# Patient Record
Sex: Male | Born: 1975 | Race: Black or African American | Hispanic: No | Marital: Married | Smoking: Light tobacco smoker
Health system: Southern US, Community
[De-identification: ages and names within clinical notes are randomized; demographics above are authoritative.]

## PROBLEM LIST (undated history)

## (undated) HISTORY — PX: SHOULDER SURGERY: SHX246

---

## 2002-08-15 ENCOUNTER — Emergency Department (HOSPITAL_COMMUNITY): Admission: EM | Admit: 2002-08-15 | Discharge: 2002-08-15 | Payer: Self-pay | Admitting: *Deleted

## 2003-03-06 ENCOUNTER — Emergency Department (HOSPITAL_COMMUNITY): Admission: EM | Admit: 2003-03-06 | Discharge: 2003-03-06 | Payer: Self-pay | Admitting: Emergency Medicine

## 2003-06-25 ENCOUNTER — Emergency Department (HOSPITAL_COMMUNITY): Admission: EM | Admit: 2003-06-25 | Discharge: 2003-06-26 | Payer: Self-pay | Admitting: Emergency Medicine

## 2003-06-26 ENCOUNTER — Encounter: Payer: Self-pay | Admitting: Emergency Medicine

## 2003-11-08 ENCOUNTER — Emergency Department (HOSPITAL_COMMUNITY): Admission: EM | Admit: 2003-11-08 | Discharge: 2003-11-08 | Payer: Self-pay | Admitting: Emergency Medicine

## 2009-02-08 ENCOUNTER — Emergency Department (HOSPITAL_COMMUNITY): Admission: EM | Admit: 2009-02-08 | Discharge: 2009-02-08 | Payer: Self-pay | Admitting: Emergency Medicine

## 2010-03-05 IMAGING — CR DG KNEE COMPLETE 4+V*L*
4 series · 4 of 4 positions shown · non-contrast
Comparison: None.

CLINICAL DATA: Knee pain.  No injury.

LEFT KNEE - COMPLETE 4+ VIEW

[t knee ap left]
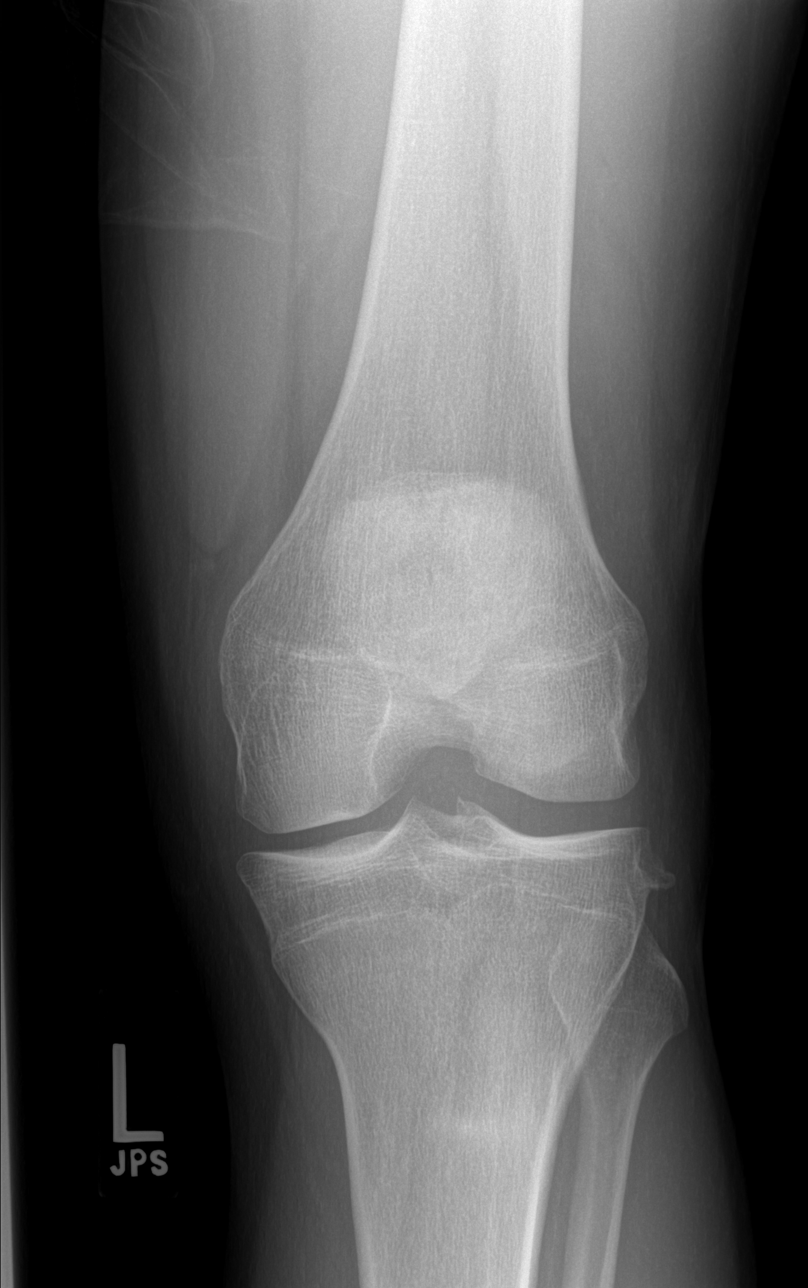

[t knee oblique left (1 of 2)]
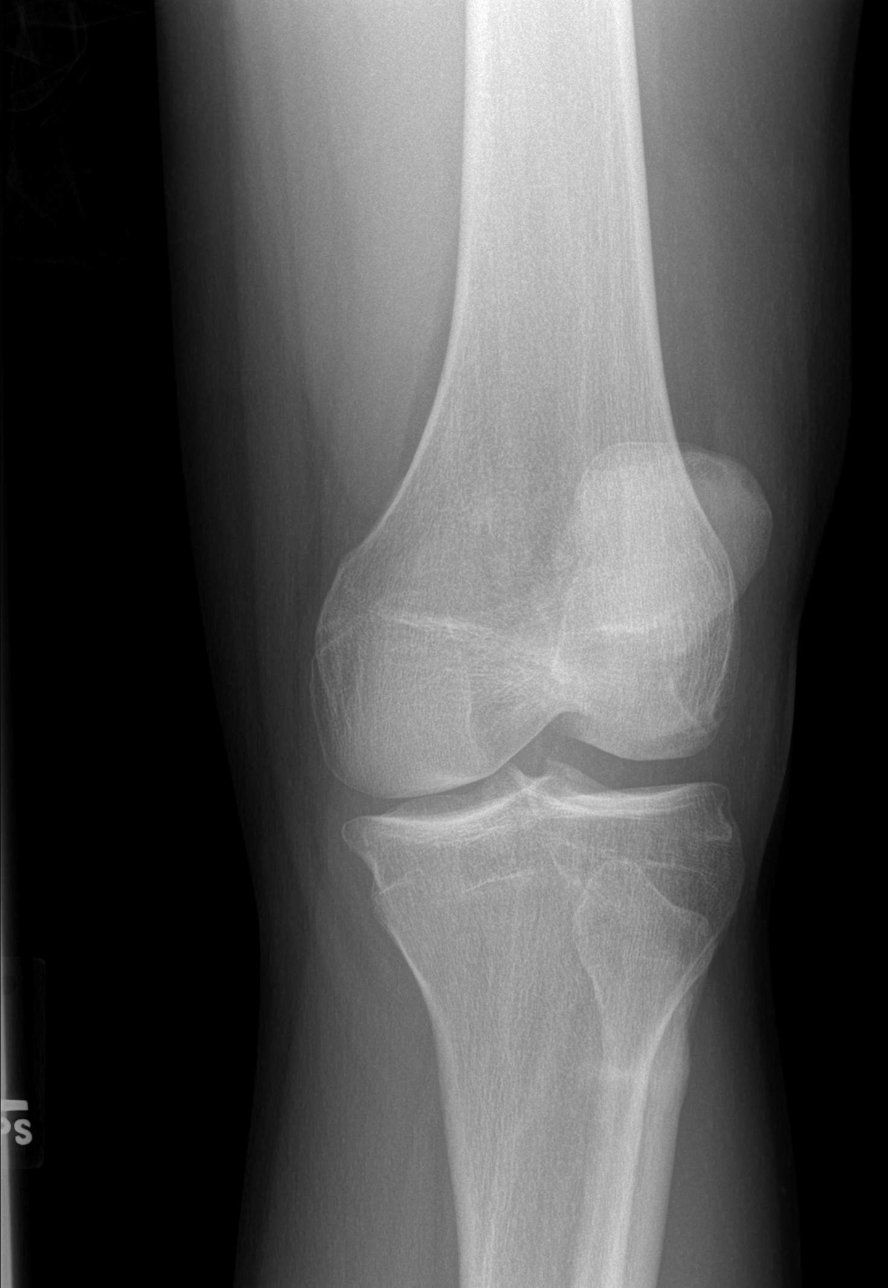

[t knee oblique left (2 of 2)]
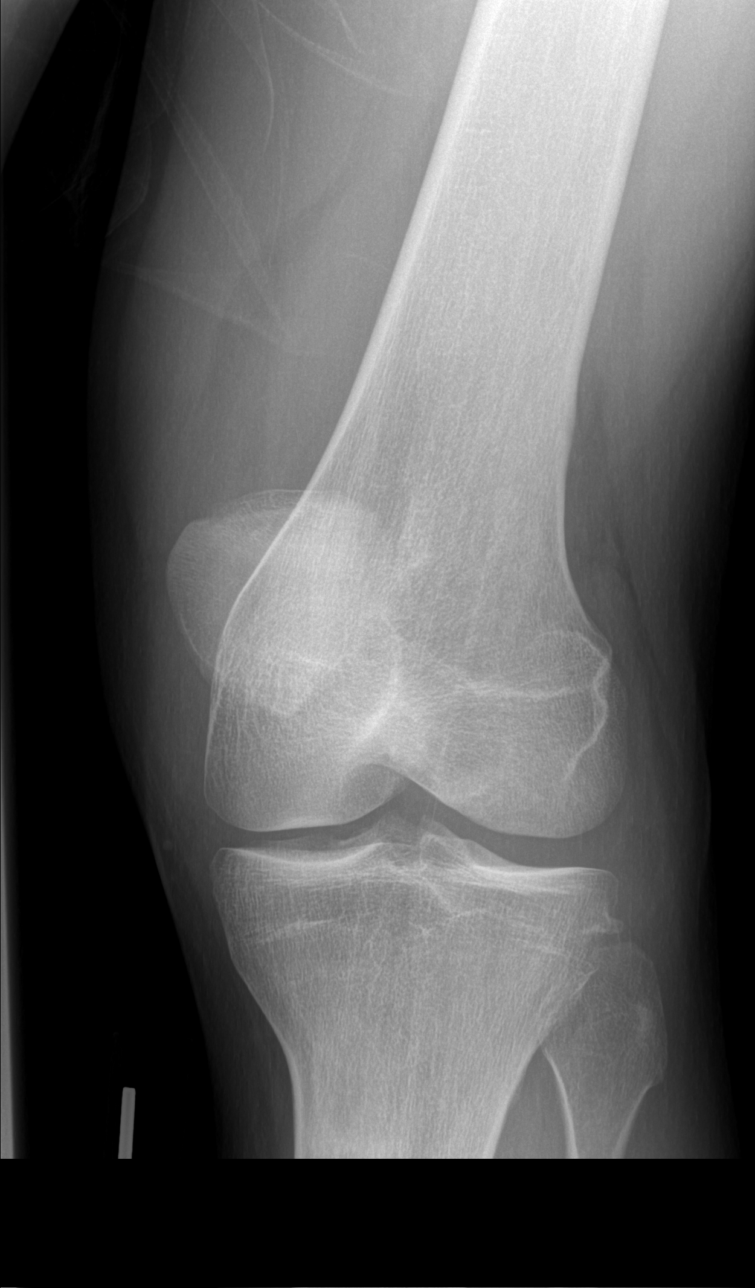

[t knee lat left]
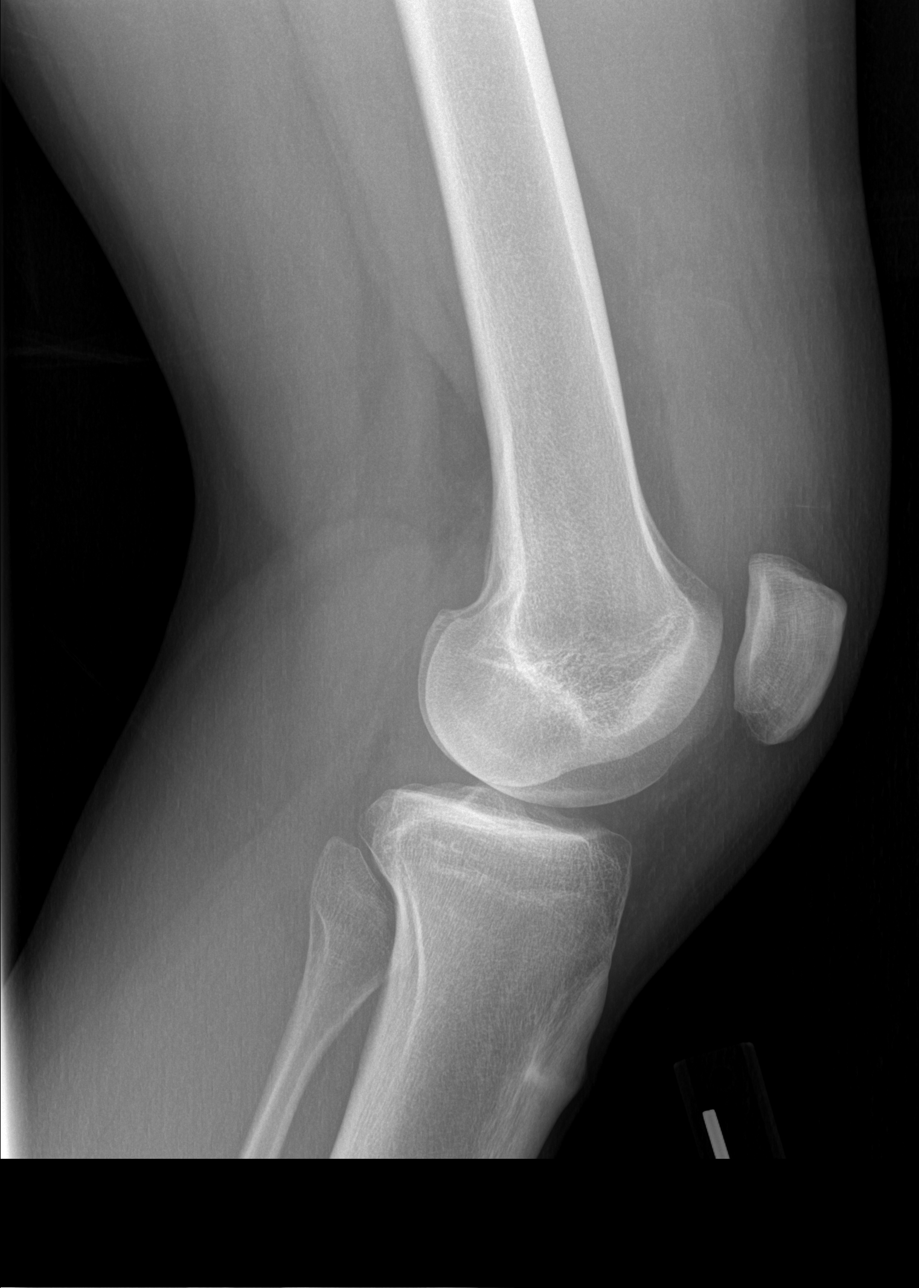

[4 of 4 positions shown; findings below may reference images not displayed]

FINDINGS: There is a lateral osteophyte arising from the lateral
aspect of the proximal tibia.  No fracture is identified.  Joint
spaces are well maintained.  There is a moderate-to-large joint
effusion.  This may indicate internal derangement.
IMPRESSION: Joint effusion without acute bony abnormality.

## 2018-08-13 ENCOUNTER — Emergency Department (HOSPITAL_BASED_OUTPATIENT_CLINIC_OR_DEPARTMENT_OTHER): Payer: 59

## 2018-08-13 ENCOUNTER — Emergency Department (HOSPITAL_BASED_OUTPATIENT_CLINIC_OR_DEPARTMENT_OTHER)
Admission: EM | Admit: 2018-08-13 | Discharge: 2018-08-13 | Disposition: A | Discharge status: Home / Self Care | Payer: 59 | Attending: Emergency Medicine | Admitting: Emergency Medicine

## 2018-08-13 ENCOUNTER — Encounter (HOSPITAL_BASED_OUTPATIENT_CLINIC_OR_DEPARTMENT_OTHER): Payer: Self-pay | Admitting: Emergency Medicine

## 2018-08-13 ENCOUNTER — Other Ambulatory Visit: Payer: Self-pay

## 2018-08-13 DIAGNOSIS — S39012A Strain of muscle, fascia and tendon of lower back, initial encounter: Secondary | ICD-10-CM | POA: Insufficient documentation

## 2018-08-13 DIAGNOSIS — Y939 Activity, unspecified: Secondary | ICD-10-CM | POA: Insufficient documentation

## 2018-08-13 DIAGNOSIS — F17210 Nicotine dependence, cigarettes, uncomplicated: Secondary | ICD-10-CM | POA: Diagnosis not present

## 2018-08-13 DIAGNOSIS — Y9389 Activity, other specified: Secondary | ICD-10-CM | POA: Diagnosis not present

## 2018-08-13 DIAGNOSIS — X509XXA Other and unspecified overexertion or strenuous movements or postures, initial encounter: Secondary | ICD-10-CM | POA: Insufficient documentation

## 2018-08-13 DIAGNOSIS — Y999 Unspecified external cause status: Secondary | ICD-10-CM | POA: Diagnosis not present

## 2018-08-13 DIAGNOSIS — Y929 Unspecified place or not applicable: Secondary | ICD-10-CM | POA: Diagnosis not present

## 2018-08-13 DIAGNOSIS — S3992XA Unspecified injury of lower back, initial encounter: Secondary | ICD-10-CM | POA: Diagnosis present

## 2018-08-13 NOTE — ED Triage Notes (Signed)
PT presents with c/o lower back pain since Sunday and was seen at [facility] yesterday for same. Pt states he is no better and feels like his right leg is tingling. Family states his lower back is swollen.

## 2018-08-13 NOTE — Discharge Instructions (Signed)
Please read and follow all provided instructions.  Your diagnoses today include:  1. Strain of lumbar region, initial encounter     Tests performed today include:  Vital signs - see below for your results today  X-ray of your lower back -does not show any problems  Medications prescribed:   None  Take any prescribed medications only as directed.  Home care instructions:   Follow any educational materials contained in this packet  Please rest, use ice or heat on your back for the next several days  Do not lift, push, pull anything more than 10 pounds for the next week  Follow-up instructions: Please follow-up with your primary care provider in the next 1 week for further evaluation of your symptoms.   Return instructions:  SEEK IMMEDIATE MEDICAL ATTENTION IF YOU HAVE:  New numbness, tingling, weakness, or problem with the use of your arms or legs  Severe back pain not relieved with medications  Loss control of your bowels or bladder  Increasing pain in any areas of the body (such as chest or abdominal pain)  Shortness of breath, dizziness, or fainting.   Worsening nausea (feeling sick to your stomach), vomiting, fever, or sweats  Any other emergent concerns regarding your health   Additional Information:  Your vital signs today were: BP 122/90 (BP Location: Left Arm)    Pulse 79    Temp 98.2 F (36.8 C) (Oral)    Resp 18    Ht 6\' 1"  (1.854 m)    Wt 83.9 kg    SpO2 100%    BMI 24.41 kg/m  If your blood pressure (BP) was elevated above 135/85 this visit, please have this repeated by your doctor within one month. --------------

## 2018-08-13 NOTE — ED Provider Notes (Signed)
[facility] EMERGENCY DEPARTMENT Provider Note   CSN: [id number] Arrival date & time: 08/13/18  0915     History   Chief Complaint Chief Complaint  Patient presents with  . Back Pain    HPI George Cox is a 42 y.o. male.  Patient presents to the emergency department with complaint of lower back pain ongoing over the past 2 to 3 days.  Pain started when the patient bent over and twisted to pick up some garbage.  He had persistent pain that did not improve.  In certain positions he has "tingling" that extends into the right leg.  He was seen at [facility] hospital yesterday and was prescribed Flexeril and a Medrol Dosepak.  He is taken 1 dose of these medications.  Yesterday he reports feeling off balance and having a fall.  Since that time he feels like his right lower back is swollen. Patient denies warning symptoms of back pain including: fecal incontinence, urinary retention or overflow incontinence, night sweats, waking from sleep with back pain, unexplained fevers or weight loss, h/o cancer, IVDU, recent trauma.        History reviewed. No pertinent past medical history.  There are no active problems to display for this patient.   Past Surgical History:  Procedure Laterality Date  . SHOULDER SURGERY          Home Medications    Prior to Admission medications   Not on File    Family History No family history on file.  Social History Social History   Tobacco Use  . Smoking status: Light Tobacco Smoker  . Smokeless tobacco: Never Used  Substance Use Topics  . Alcohol use: Yes  . Drug use: Never     Allergies   Patient has no known allergies.   Review of Systems Review of Systems  Constitutional: Negative for fever and unexpected weight change.  Gastrointestinal: Negative for constipation.       Neg for fecal incontinence  Genitourinary: Negative for difficulty urinating, flank pain and hematuria.       Negative for urinary  incontinence or retention  Musculoskeletal: Positive for back pain.  Neurological: Negative for weakness and numbness.       Negative for saddle paresthesias      Physical Exam Updated Vital Signs BP 122/90 (BP Location: Left Arm)   Pulse 79   Temp 98.2 F (36.8 C) (Oral)   Resp 18   Ht 6\' 1"  (1.854 m)   Wt 83.9 kg   SpO2 100%   BMI 24.41 kg/m   Physical Exam  Constitutional: He appears well-developed and well-nourished.  HENT:  Head: Normocephalic and atraumatic.  Eyes: Conjunctivae are normal.  Neck: Normal range of motion.  Abdominal: Soft. There is no tenderness. There is no CVA tenderness.  Musculoskeletal: Normal range of motion.       Cervical back: He exhibits normal range of motion, no tenderness and no bony tenderness.       Thoracic back: He exhibits normal range of motion, no tenderness and no bony tenderness.       Lumbar back: He exhibits tenderness. He exhibits normal range of motion and no bony tenderness.       Back:  No step-off noted with palpation of spine.   Neurological: He is alert. He has normal reflexes. No sensory deficit. He exhibits normal muscle tone.  5/5 strength in entire lower extremities bilaterally. No sensation deficit.   Skin: Skin is warm and dry.  Psychiatric: He has a normal mood and affect.  Nursing note and vitals reviewed.    ED Treatments / Results  Labs (all labs ordered are listed, but only abnormal results are displayed) Labs Reviewed - No data to display  EKG None  Radiology Dg Lumbar Spine Complete  Result Date: 08/13/2018 CLINICAL DATA:  Acute low back and left leg pain after fall. EXAM: LUMBAR SPINE - COMPLETE 4+ VIEW COMPARISON:  None. FINDINGS: There is no evidence of lumbar spine fracture. Alignment is normal. Intervertebral disc spaces are maintained. IMPRESSION: Negative. Electronically Signed   By: Lupita Raider, M.D.   On: 08/13/2018 11:56    Procedures Procedures (including critical care  time)  Medications Ordered in ED Medications - No data to display   Initial Impression / Assessment and Plan / ED Course  I have reviewed the triage vital signs and the nursing notes.  Pertinent labs & imaging results that were available during my care of the patient were reviewed by me and considered in my medical decision making (see chart for details).     11:37 AM Patient seen and examined.  Patient requests that we do additional studies today.  Discussed limitations of x-ray of the back and patient would like to get an x-ray.  Vital signs reviewed and are as follows: Vitals:   08/13/18 0928  BP: 122/90  Pulse: 79  Resp: 18  Temp: 98.2 F (36.8 C)  SpO2: 100%   12:32 PM Pt updated on results.   No red flag s/s of low back pain. Patient was counseled on back pain precautions and told to do activity as tolerated but do not lift, push, or pull heavy objects more than 10 pounds for the next week.  Patient counseled to use ice or heat on back for no longer than 15 minutes every hour.   He will continue to take previously prescribed medications.   Patient urged to follow-up with PCP if pain does not improve with treatment and rest or if pain becomes recurrent. Urged to return with worsening severe pain, loss of bowel or bladder control, trouble walking.   The patient verbalizes understanding and agrees with the plan.    Final Clinical Impressions(s) / ED Diagnoses   Final diagnoses:  Strain of lumbar region, initial encounter   Patient with back pain. Imaging with plain films neg. No neurological deficits. Patient is ambulatory. No warning symptoms of back pain including: fecal incontinence, urinary retention or overflow incontinence, night sweats, waking from sleep with back pain, unexplained fevers or weight loss, h/o cancer, IVDU, recent trauma. No concern for cauda equina, epidural abscess, or other serious cause of back pain. Conservative measures such as rest, ice/heat  and pain medicine indicated with PCP follow-up if no improvement with conservative management.    ED Discharge Orders    None       Renne Crigler, [place] 08/13/18 1233    Tegeler, Canary Brim, MD 08/13/18 [phone]
